# Patient Record
Sex: Female | Born: 1945 | Race: White | Hispanic: No | Smoking: Never smoker
Health system: Southern US, Community
[De-identification: ages and names within clinical notes are randomized; demographics above are authoritative.]

## PROBLEM LIST (undated history)

## (undated) DIAGNOSIS — I8390 Asymptomatic varicose veins of unspecified lower extremity: Secondary | ICD-10-CM

## (undated) HISTORY — DX: Asymptomatic varicose veins of unspecified lower extremity: I83.90

## (undated) HISTORY — PX: VEIN LIGATION AND STRIPPING: SHX2653

## (undated) HISTORY — PX: ENDOVENOUS ABLATION SAPHENOUS VEIN W/ LASER: SUR449

---

## 1998-01-27 ENCOUNTER — Ambulatory Visit (HOSPITAL_COMMUNITY): Admission: RE | Admit: 1998-01-27 | Discharge: 1998-01-27 | Payer: Self-pay | Admitting: Family Medicine

## 1998-12-02 ENCOUNTER — Emergency Department (HOSPITAL_COMMUNITY): Admission: EM | Admit: 1998-12-02 | Discharge: 1998-12-02 | Payer: Self-pay | Admitting: Emergency Medicine

## 1999-01-24 ENCOUNTER — Ambulatory Visit (HOSPITAL_COMMUNITY): Admission: RE | Admit: 1999-01-24 | Discharge: 1999-01-24 | Payer: Self-pay

## 1999-01-31 ENCOUNTER — Ambulatory Visit (HOSPITAL_COMMUNITY): Admission: RE | Admit: 1999-01-31 | Discharge: 1999-01-31 | Payer: Self-pay | Admitting: Family Medicine

## 1999-01-31 ENCOUNTER — Encounter: Payer: Self-pay | Admitting: Family Medicine

## 2000-01-31 ENCOUNTER — Ambulatory Visit: Admission: RE | Admit: 2000-01-31 | Discharge: 2000-01-31 | Payer: Self-pay

## 2000-10-22 ENCOUNTER — Encounter: Payer: Self-pay | Admitting: Family Medicine

## 2000-10-22 ENCOUNTER — Ambulatory Visit (HOSPITAL_COMMUNITY): Admission: RE | Admit: 2000-10-22 | Discharge: 2000-10-22 | Payer: Self-pay | Admitting: Family Medicine

## 2002-11-03 ENCOUNTER — Ambulatory Visit (HOSPITAL_COMMUNITY): Admission: RE | Admit: 2002-11-03 | Discharge: 2002-11-03 | Payer: Self-pay | Admitting: Family Medicine

## 2002-11-03 ENCOUNTER — Encounter: Payer: Self-pay | Admitting: Family Medicine

## 2003-04-05 ENCOUNTER — Encounter: Payer: Self-pay | Admitting: Family Medicine

## 2003-04-05 ENCOUNTER — Ambulatory Visit (HOSPITAL_COMMUNITY): Admission: RE | Admit: 2003-04-05 | Discharge: 2003-04-05 | Payer: Self-pay | Admitting: Family Medicine

## 2004-09-20 ENCOUNTER — Other Ambulatory Visit: Admission: RE | Admit: 2004-09-20 | Discharge: 2004-09-20 | Payer: Self-pay | Admitting: Family Medicine

## 2004-09-26 ENCOUNTER — Ambulatory Visit (HOSPITAL_COMMUNITY): Admission: RE | Admit: 2004-09-26 | Discharge: 2004-09-26 | Payer: Self-pay | Admitting: Family Medicine

## 2005-11-05 ENCOUNTER — Ambulatory Visit (HOSPITAL_COMMUNITY): Admission: RE | Admit: 2005-11-05 | Discharge: 2005-11-05 | Payer: Self-pay | Admitting: Family Medicine

## 2007-01-02 ENCOUNTER — Encounter: Admission: RE | Admit: 2007-01-02 | Discharge: 2007-01-02 | Payer: Self-pay | Admitting: Family Medicine

## 2008-04-22 ENCOUNTER — Other Ambulatory Visit: Admission: RE | Admit: 2008-04-22 | Discharge: 2008-04-22 | Payer: Self-pay | Admitting: Family Medicine

## 2009-06-25 HISTORY — PX: JOINT REPLACEMENT: SHX530

## 2009-11-11 ENCOUNTER — Encounter: Admission: RE | Admit: 2009-11-11 | Discharge: 2009-11-11 | Payer: Self-pay | Admitting: Orthopedic Surgery

## 2010-03-29 ENCOUNTER — Inpatient Hospital Stay (HOSPITAL_COMMUNITY): Admission: RE | Admit: 2010-03-29 | Discharge: 2010-04-01 | Payer: Self-pay | Admitting: Orthopedic Surgery

## 2010-09-07 LAB — APTT: aPTT: 34 seconds (ref 24–37)

## 2010-09-07 LAB — URINALYSIS, ROUTINE W REFLEX MICROSCOPIC
Hgb urine dipstick: NEGATIVE
Specific Gravity, Urine: 1.008 (ref 1.005–1.030)

## 2010-09-07 LAB — CBC
HCT: 36.3 % (ref 36.0–46.0)
Hemoglobin: 10.1 g/dL — ABNORMAL LOW (ref 12.0–15.0)
Hemoglobin: 11.7 g/dL — ABNORMAL LOW (ref 12.0–15.0)
MCH: 31.6 pg (ref 26.0–34.0)
MCH: 31.8 pg (ref 26.0–34.0)
MCHC: 34 g/dL (ref 30.0–36.0)
MCHC: 34 g/dL (ref 30.0–36.0)
MCHC: 34.4 g/dL (ref 30.0–36.0)
MCV: 92.6 fL (ref 78.0–100.0)
MCV: 93.1 fL (ref 78.0–100.0)
MCV: 93.1 fL (ref 78.0–100.0)
Platelets: 261 10*3/uL (ref 150–400)
Platelets: 311 10*3/uL (ref 150–400)
RBC: 3.19 MIL/uL — ABNORMAL LOW (ref 3.87–5.11)
RDW: 13.2 % (ref 11.5–15.5)
RDW: 13.2 % (ref 11.5–15.5)
RDW: 13.3 % (ref 11.5–15.5)
WBC: 8.3 10*3/uL (ref 4.0–10.5)
WBC: 8.8 10*3/uL (ref 4.0–10.5)

## 2010-09-07 LAB — PROTIME-INR
INR: 1.75 — ABNORMAL HIGH (ref 0.00–1.49)
INR: 0.96 (ref 0.00–1.49)
Prothrombin Time: 27.5 seconds — ABNORMAL HIGH (ref 11.6–15.2)
Prothrombin Time: 13 seconds (ref 11.6–15.2)

## 2010-09-07 LAB — COMPREHENSIVE METABOLIC PANEL
ALT: 49 U/L — ABNORMAL HIGH (ref 0–35)
CO2: 27 mEq/L (ref 19–32)
Calcium: 9.5 mg/dL (ref 8.4–10.5)
Chloride: 110 mEq/L (ref 96–112)
GFR calc Af Amer: >60 mL/min (ref >60)
Potassium: 3.8 mEq/L (ref 3.5–5.1)
Sodium: 143 mEq/L (ref 135–145)
Total Bilirubin: 0.7 mg/dL (ref 0.3–1.2)

## 2010-09-07 LAB — BASIC METABOLIC PANEL
BUN: 7 mg/dL (ref 6–23)
CO2: 26 mEq/L (ref 19–32)
Calcium: 9 mg/dL (ref 8.4–10.5)
Creatinine, Ser: 0.69 mg/dL (ref 0.4–1.2)
GFR calc Af Amer: >60 mL/min (ref >60)
GFR calc non Af Amer: >60 mL/min (ref >60)
Potassium: 4 mEq/L (ref 3.5–5.1)

## 2010-09-07 LAB — URINE MICROSCOPIC-ADD ON

## 2010-09-07 LAB — TYPE AND SCREEN: ABO/RH(D): O POS

## 2010-09-07 LAB — SURGICAL PCR SCREEN: Staphylococcus aureus: POSITIVE — AB

## 2011-02-20 ENCOUNTER — Other Ambulatory Visit (HOSPITAL_COMMUNITY): Payer: Self-pay | Admitting: Family Medicine

## 2011-02-20 DIAGNOSIS — Z1231 Encounter for screening mammogram for malignant neoplasm of breast: Secondary | ICD-10-CM

## 2011-03-01 ENCOUNTER — Ambulatory Visit (HOSPITAL_COMMUNITY)
Admission: RE | Admit: 2011-03-01 | Discharge: 2011-03-01 | Disposition: A | Discharge status: Home / Self Care | Payer: BC Managed Care – PPO | Source: Ambulatory Visit | Attending: Family Medicine | Admitting: Family Medicine

## 2011-03-01 DIAGNOSIS — Z1231 Encounter for screening mammogram for malignant neoplasm of breast: Secondary | ICD-10-CM | POA: Insufficient documentation

## 2011-03-29 ENCOUNTER — Other Ambulatory Visit (HOSPITAL_COMMUNITY)
Admission: RE | Admit: 2011-03-29 | Discharge: 2011-03-29 | Disposition: A | Discharge status: Home / Self Care | Payer: BC Managed Care – PPO | Source: Ambulatory Visit | Attending: Family Medicine | Admitting: Family Medicine

## 2011-03-29 ENCOUNTER — Other Ambulatory Visit: Payer: Self-pay | Admitting: Family Medicine

## 2011-03-29 DIAGNOSIS — Z01419 Encounter for gynecological examination (general) (routine) without abnormal findings: Secondary | ICD-10-CM | POA: Insufficient documentation

## 2013-02-19 ENCOUNTER — Other Ambulatory Visit (HOSPITAL_COMMUNITY): Payer: Self-pay | Admitting: Family Medicine

## 2013-02-19 DIAGNOSIS — Z1231 Encounter for screening mammogram for malignant neoplasm of breast: Secondary | ICD-10-CM

## 2013-03-04 ENCOUNTER — Ambulatory Visit (HOSPITAL_COMMUNITY)
Admission: RE | Admit: 2013-03-04 | Discharge: 2013-03-04 | Disposition: A | Discharge status: Home / Self Care | Payer: Medicare PPO | Source: Ambulatory Visit | Attending: Family Medicine | Admitting: Family Medicine

## 2013-03-04 DIAGNOSIS — Z1231 Encounter for screening mammogram for malignant neoplasm of breast: Secondary | ICD-10-CM | POA: Insufficient documentation

## 2014-02-24 ENCOUNTER — Other Ambulatory Visit (HOSPITAL_COMMUNITY): Payer: Self-pay | Admitting: Family Medicine

## 2014-02-24 DIAGNOSIS — Z1231 Encounter for screening mammogram for malignant neoplasm of breast: Secondary | ICD-10-CM

## 2014-03-12 ENCOUNTER — Ambulatory Visit (HOSPITAL_COMMUNITY)
Admission: RE | Admit: 2014-03-12 | Discharge: 2014-03-12 | Disposition: A | Discharge status: Home / Self Care | Payer: Medicare PPO | Source: Ambulatory Visit | Attending: Family Medicine | Admitting: Family Medicine

## 2014-03-12 DIAGNOSIS — Z1231 Encounter for screening mammogram for malignant neoplasm of breast: Secondary | ICD-10-CM | POA: Diagnosis not present

## 2014-09-29 ENCOUNTER — Other Ambulatory Visit: Payer: Self-pay | Admitting: Family Medicine

## 2014-09-29 ENCOUNTER — Other Ambulatory Visit (HOSPITAL_COMMUNITY)
Admission: RE | Admit: 2014-09-29 | Discharge: 2014-09-29 | Disposition: A | Discharge status: Home / Self Care | Payer: Medicare PPO | Source: Ambulatory Visit | Attending: Family Medicine | Admitting: Family Medicine

## 2014-09-29 DIAGNOSIS — Z0000 Encounter for general adult medical examination without abnormal findings: Secondary | ICD-10-CM | POA: Diagnosis not present

## 2014-09-29 DIAGNOSIS — Z23 Encounter for immunization: Secondary | ICD-10-CM | POA: Diagnosis not present

## 2014-09-29 DIAGNOSIS — Z124 Encounter for screening for malignant neoplasm of cervix: Secondary | ICD-10-CM | POA: Diagnosis not present

## 2014-09-29 DIAGNOSIS — Z13220 Encounter for screening for lipoid disorders: Secondary | ICD-10-CM | POA: Diagnosis not present

## 2014-09-29 DIAGNOSIS — Z136 Encounter for screening for cardiovascular disorders: Secondary | ICD-10-CM | POA: Diagnosis not present

## 2014-09-30 LAB — CYTOLOGY - PAP

## 2015-02-24 ENCOUNTER — Other Ambulatory Visit (HOSPITAL_COMMUNITY): Payer: Self-pay | Admitting: Family Medicine

## 2015-02-24 DIAGNOSIS — Z1231 Encounter for screening mammogram for malignant neoplasm of breast: Secondary | ICD-10-CM

## 2015-03-09 DIAGNOSIS — Z23 Encounter for immunization: Secondary | ICD-10-CM | POA: Diagnosis not present

## 2015-04-05 DIAGNOSIS — Z96641 Presence of right artificial hip joint: Secondary | ICD-10-CM | POA: Diagnosis not present

## 2015-04-05 DIAGNOSIS — Z471 Aftercare following joint replacement surgery: Secondary | ICD-10-CM | POA: Diagnosis not present

## 2015-04-08 ENCOUNTER — Ambulatory Visit
Admission: RE | Admit: 2015-04-08 | Discharge: 2015-04-08 | Disposition: A | Discharge status: Home / Self Care | Payer: Medicare PPO | Source: Ambulatory Visit | Attending: Family Medicine | Admitting: Family Medicine

## 2015-04-08 DIAGNOSIS — Z1231 Encounter for screening mammogram for malignant neoplasm of breast: Secondary | ICD-10-CM

## 2015-07-19 ENCOUNTER — Ambulatory Visit
Admission: RE | Admit: 2015-07-19 | Discharge: 2015-07-19 | Disposition: A | Discharge status: Home / Self Care | Payer: Medicare Other | Source: Ambulatory Visit | Attending: Cardiology | Admitting: Cardiology

## 2015-07-19 ENCOUNTER — Other Ambulatory Visit: Payer: Self-pay | Admitting: Cardiology

## 2015-07-19 DIAGNOSIS — R0789 Other chest pain: Secondary | ICD-10-CM

## 2015-07-22 ENCOUNTER — Other Ambulatory Visit: Payer: Self-pay | Admitting: *Deleted

## 2015-07-22 DIAGNOSIS — I872 Venous insufficiency (chronic) (peripheral): Secondary | ICD-10-CM

## 2015-08-23 ENCOUNTER — Encounter: Payer: Self-pay | Admitting: Vascular Surgery

## 2015-08-24 ENCOUNTER — Encounter: Payer: Medicare Other | Admitting: Vascular Surgery

## 2015-08-24 ENCOUNTER — Encounter (HOSPITAL_COMMUNITY): Payer: Medicare Other

## 2015-08-30 ENCOUNTER — Ambulatory Visit (INDEPENDENT_AMBULATORY_CARE_PROVIDER_SITE_OTHER): Payer: Medicare Other | Admitting: Vascular Surgery

## 2015-08-30 ENCOUNTER — Encounter: Payer: Self-pay | Admitting: Vascular Surgery

## 2015-08-30 ENCOUNTER — Ambulatory Visit (HOSPITAL_COMMUNITY)
Admission: RE | Admit: 2015-08-30 | Discharge: 2015-08-30 | Disposition: A | Discharge status: Home / Self Care | Payer: Medicare Other | Source: Ambulatory Visit | Attending: Vascular Surgery | Admitting: Vascular Surgery

## 2015-08-30 VITALS — BP 123/73 | HR 62 | Temp 97.6°F | Resp 16 | Ht 65.5 in | Wt 160.0 lb

## 2015-08-30 DIAGNOSIS — I83892 Varicose veins of left lower extremities with other complications: Secondary | ICD-10-CM

## 2015-08-30 DIAGNOSIS — I83899 Varicose veins of unspecified lower extremities with other complications: Secondary | ICD-10-CM | POA: Insufficient documentation

## 2015-08-30 DIAGNOSIS — I872 Venous insufficiency (chronic) (peripheral): Secondary | ICD-10-CM

## 2015-08-30 DIAGNOSIS — R0609 Other forms of dyspnea: Secondary | ICD-10-CM | POA: Diagnosis present

## 2015-08-30 NOTE — Progress Notes (Signed)
Subjective:     Patient ID: Whitney [name], female   DOB: July 14, 1945, 70 y.o.   MRN: [id number]  HPI  This 70 year old female is evaluated for painful varicosities in the left leg. She was referred by Dr. Viann FishSpencer Tilley.  Patient has a history of vein stripping in the left leg by Dr. Marcy PanningBill Bowman around 2000. Patient developed recurrent varicosities in the left calf medially and laterally. It sounds as if the vein was stripped from the ankle to the knee. She has no history of DVT or thrombophlebitis. She does have a history of a stasis ulcer in the left ankle has chronic swelling in both ankles left worsening right. She does wear short leg elastic compression stockings bilaterally. She develops aching throbbing and burning discomfort as the day progresses.  Past Medical History  Diagnosis Date  . Varicose veins     Social History  Substance Use Topics  . Smoking status: Never Smoker   . Smokeless tobacco: Never Used  . Alcohol Use: No    Family History  Problem Relation Age of Onset  . Osteoporosis Mother   . Cancer Mother     bone  . COPD Father     Allergies  Allergen Reactions  . Amoxicillin Rash     Current outpatient prescriptions:  .  ALENDRONATE SODIUM PO, Take 70 mg by mouth once a week., Disp: , Rfl:  .  Calcium Carb-Cholecalciferol (CALCIUM 1000 + D PO), Take by mouth daily., Disp: , Rfl:  .  metronidazole (NORITATE) 1 % cream, Apply topically daily., Disp: , Rfl:  .  Multiple Vitamins-Minerals (MULTIVITAMIN ADULT PO), Take by mouth daily., Disp: , Rfl:   Filed Vitals:   08/30/15 1117  BP: 123/73  Pulse: 62  Temp: 97.6 F (36.4 C)  Resp: 16  Height: 5' 5.5" (1.664 m)  Weight: 160 lb (72.576 kg)  SpO2: 99%    Body mass index is 26.21 kg/(m^2).          Review of Systems  Denies chest pain, dyspnea on exertion, PND, orthopnea, hemoptysis, claudication     Objective:   Physical Exam BP 123/73 mmHg  Pulse 62  Temp(Src) 97.6 F (36.4 C)  Resp 16   Ht 5' 5.5" (1.664 m)  Wt 160 lb (72.576 kg)  BMI 26.21 kg/m2  SpO2 99%  Gen.-alert and oriented x3 in no apparent distress HEENT normal for age Lungs no rhonchi or wheezing Cardiovascular regular rhythm no murmurs carotid pulses 3+ palpable no bruits audible Abdomen soft nontender no palpable masses Musculoskeletal free of  major deformities Skin clear -no rashes Neurologic normal Lower extremities 3+ femoral and dorsalis pedis pulses palpable bilaterally with  1+ edema on the left. Left leg with extensive bulging varicosities beginning at the knee level medially and in the pretibial and lateral calf extending down to both malleolar I. 1+ edema. Evidence of healed ulcer over medial malleolar area on the left. 3+ dorsalis pedis pulse palpable bilaterally.  Right leg with lesser degree of varicosities in the medial calf.   Today I ordered a venous duplex exam the both legs which I reviewed and interpreted. The left great saphenous vein is large and patent from the saphenofemoral junction to the knee with gross reflux supplying these painful varicosities. Left small saphenous vein has no reflux and there is reflux in the deep system on the left. Right leg has no reflux in the small or great saphenous systems.     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 This is due to gross reflux in the left great saphenous vein from the saphenofemoral junction to the knee which is supplying these painful varicosities. These are causing symptoms which are affecting patient's daily living  And also she is developing worsening skin with history of stasis ulcer.     Plan:         #1 long leg elastic compression stockings 20-30 mm gradient #2 elevate legs as much as possible #3 ibuprofen daily on a regular basis for pain #4 return in 3 months-if no significant improvement then  She will need #1 laser ablation left great saphenous  vein followed by three-month waiting.. Following this she will need to be evaluated for multiple stab phlebectomy of left leg painful varicosities   She will return in 3 months

## 2015-12-01 ENCOUNTER — Encounter: Payer: Self-pay | Admitting: Vascular Surgery

## 2015-12-06 ENCOUNTER — Ambulatory Visit (INDEPENDENT_AMBULATORY_CARE_PROVIDER_SITE_OTHER): Payer: Medicare Other | Admitting: Vascular Surgery

## 2015-12-06 ENCOUNTER — Encounter: Payer: Self-pay | Admitting: Vascular Surgery

## 2015-12-06 ENCOUNTER — Other Ambulatory Visit: Payer: Self-pay | Admitting: *Deleted

## 2015-12-06 VITALS — BP 126/79 | HR 70 | Temp 98.0°F | Resp 16 | Ht 65.5 in | Wt 158.0 lb

## 2015-12-06 DIAGNOSIS — I83812 Varicose veins of left lower extremities with pain: Secondary | ICD-10-CM

## 2015-12-06 DIAGNOSIS — I83892 Varicose veins of left lower extremities with other complications: Secondary | ICD-10-CM | POA: Diagnosis not present

## 2015-12-06 NOTE — Progress Notes (Signed)
Subjective:     Patient ID: Whitney [name], female   DOB: [birth year], 70 y.o.   MRN: [id number]  HPI this 70 year old female returns for continued follow-up regarding her painful varicosities in the left leg. She had previous vein stripping by Dr. Marcy PanningBill Bowman from the ankle to the knee in 2001. She developed recurrent bulges in the calf and distal thigh area which have become increasingly symptomatic with aching throbbing and burning discomfort. She is tried long-leg elastic compression stockings 20-30 millimeter gradient as well as elevation and ibuprofen with no improvement in her symptoms. This is affecting her daily living. She also developed swelling in the ankle as the day progresses.  Past Medical History  Diagnosis Date  . Varicose veins     Social History  Substance Use Topics  . Smoking status: Never Smoker   . Smokeless tobacco: Never Used  . Alcohol Use: No    Family History  Problem Relation Age of Onset  . Osteoporosis Mother   . Cancer Mother     bone  . COPD Father     Allergies  Allergen Reactions  . Amoxicillin Rash     Current outpatient prescriptions:  .  ALENDRONATE SODIUM PO, Take 70 mg by mouth once a week., Disp: , Rfl:  .  aspirin 81 MG tablet, Take 81 mg by mouth daily., Disp: , Rfl:  .  Calcium Carb-Cholecalciferol (CALCIUM 1000 + D PO), Take by mouth daily., Disp: , Rfl:  .  metronidazole (NORITATE) 1 % cream, Apply topically daily., Disp: , Rfl:  .  Multiple Vitamins-Minerals (MULTIVITAMIN ADULT PO), Take by mouth daily., Disp: , Rfl:   Filed Vitals:   12/06/15 1055  BP: 126/79  Pulse: 70  Temp: 98 F (36.7 C)  Resp: 16  Height: 5' 5.5" (1.664 m)  Weight: 158 lb (71.668 kg)  SpO2: 100%    Body mass index is 25.88 kg/(m^2).         Review of Systems denies chest pain, dyspnea on exertion, PND, orthopnea, hemoptysis, claudication    Objective:   Physical Exam BP 126/79 mmHg  Pulse 70  Temp(Src) 98 F (36.7 C)  Resp 16  Ht 5'  5.5" (1.664 m)  Wt 158 lb (71.668 kg)  BMI 25.88 kg/m2  SpO2 100%  Gen. well-developed well-nourished female no apparent distress alert and oriented 3 Lungs no rhonchi or wheezing Left leg with bulging varicosities beginning in the distal medial thigh into the medial calf extending down to the medial malleolus with reticular and spider veins in that area. 1+ edema. 3+ dorsalis pedis pulse palpable. No hyperpigmentation or ulceration noted.  Formal venous duplex exam last visit revealed a large great saphenous vein from the groin to the knee supplying these painful varicosities with gross reflux and no DVT     Assessment:     Painful varicosities left leg due to gross reflux left great saphenous vein causing symptoms which are affecting patient's daily living and resistant to conservative measures including long-leg elastic compression stockings 20-30 millimeter gradient, elevation, and ibuprofen    Plan:     Patient needs #1 laser ablation left great saphenous vein followed by three-month waiting.Marland Kitchen. She will then be evaluated for possible stab phlebectomy and/or sclerotherapy. We will proceed with precertification to perform this in the near future

## 2016-01-26 ENCOUNTER — Encounter: Payer: Self-pay | Admitting: Vascular Surgery

## 2016-01-30 ENCOUNTER — Encounter: Payer: Self-pay | Admitting: Vascular Surgery

## 2016-01-30 ENCOUNTER — Ambulatory Visit (INDEPENDENT_AMBULATORY_CARE_PROVIDER_SITE_OTHER): Payer: Medicare Other | Admitting: Vascular Surgery

## 2016-01-30 VITALS — BP 128/78 | HR 82 | Temp 97.6°F | Resp 16 | Ht 65.5 in | Wt 158.0 lb

## 2016-01-30 DIAGNOSIS — I83892 Varicose veins of left lower extremities with other complications: Secondary | ICD-10-CM | POA: Diagnosis not present

## 2016-01-30 NOTE — Progress Notes (Signed)
Laser Ablation Procedure    Date: 01/30/2016   Whitney LevelLinda A [name] DOB:18-May-1946  Consent signed: Yes    Surgeon:  Dr. Quita SkyeJames D. Hart RochesterLawson  Procedure: Laser Ablation: left Greater Saphenous Vein  BP 128/78   Pulse 82   Temp 97.6 F (36.4 C)   Resp 16   Ht 5' 5.5" (1.664 m)   Wt 158 lb (71.7 kg)   SpO2 100%   BMI 25.89 kg/m   Tumescent Anesthesia: 400 cc 0.9% NaCl with 50 cc Lidocaine HCL with 1% Epi and 15 cc 8.4% NaHCO3  Local Anesthesia: 4 cc Lidocaine HCL and NaHCO3 (ratio 2:1)  Pulsed Mode: 17 watts, 500ms delay, 1.0 duration  Total Energy: 2575             Total Pulses: 153               Total Time: 2:31    Patient tolerated procedure well  Notes:   Description of Procedure:  After marking the course of the secondary varicosities, the patient was placed on the operating table in the supine position, and the left leg was prepped and draped in sterile fashion.   Local anesthetic was administered and under ultrasound guidance the saphenous vein was accessed with a micro needle and guide wire; then the mirco puncture sheath was placed.  A guide wire was inserted saphenofemoral junction , followed by a 5 french sheath.  The position of the sheath and then the laser fiber below the junction was confirmed using the ultrasound.  Tumescent anesthesia was administered along the course of the saphenous vein using ultrasound guidance. The patient was placed in Trendelenburg position and protective laser glasses were placed on patient and staff, and the laser was fired at 15 watts continuous mode advancing 1-852mm/second for a total of 2575 joules.     Steri strips were applied to the stab wounds and ABD pads and thigh high compression stockings were applied.  Ace wrap bandages were applied over the phlebectomy sites and at the top of the saphenofemoral junction. Blood loss was less than 15 cc.  The patient ambulated out of the operating room having tolerated the procedure well.

## 2016-01-30 NOTE — Progress Notes (Signed)
Subjective:     Patient ID: Whitney LevelLinda A [name], female   DOB: 02/07/1946, 70 y.o.   MRN: [id number]  HPI this 70 year old female had laser ablation left great saphenous vein from the proximal calf to near the saphenofemoral junction performed under local tumescent anesthesia. A total of 2580 J of energy was utilized. Because of the large caliber of the vein near the saphenofemoral junction the energy was increased from 15 to 17. Patient tolerated the procedure well.   Review of Systems     Objective:   Physical Exam BP 128/78   Pulse 82   Temp 97.6 F (36.4 C)   Resp 16   Ht 5' 5.5" (1.664 m)   Wt 158 lb (71.7 kg)   SpO2 100%   BMI 25.89 kg/m        Assessment:     Well tolerated laser ablation left great saphenous vein performed under local tumescent anesthesia    Plan:     Patient return in 2 weeks for venous duplex exam to confirm closure left great saphenous vein

## 2016-01-31 ENCOUNTER — Telehealth: Payer: Self-pay | Admitting: *Deleted

## 2016-01-31 ENCOUNTER — Encounter: Payer: Self-pay | Admitting: Vascular Surgery

## 2016-01-31 NOTE — Telephone Encounter (Signed)
Pt doing well. No questions or concerns. Following all instructions. 

## 2016-02-07 ENCOUNTER — Encounter: Payer: Self-pay | Admitting: Vascular Surgery

## 2016-02-13 ENCOUNTER — Ambulatory Visit (HOSPITAL_COMMUNITY)
Admission: RE | Admit: 2016-02-13 | Discharge: 2016-02-13 | Disposition: A | Discharge status: Home / Self Care | Payer: Medicare Other | Source: Ambulatory Visit | Attending: Vascular Surgery | Admitting: Vascular Surgery

## 2016-02-13 ENCOUNTER — Encounter: Payer: Self-pay | Admitting: Vascular Surgery

## 2016-02-13 ENCOUNTER — Ambulatory Visit (INDEPENDENT_AMBULATORY_CARE_PROVIDER_SITE_OTHER): Payer: Medicare Other | Admitting: Vascular Surgery

## 2016-02-13 VITALS — BP 120/77 | HR 62 | Temp 97.8°F | Resp 16 | Ht 65.5 in | Wt 159.0 lb

## 2016-02-13 DIAGNOSIS — I83812 Varicose veins of left lower extremities with pain: Secondary | ICD-10-CM | POA: Insufficient documentation

## 2016-02-13 DIAGNOSIS — I83892 Varicose veins of left lower extremities with other complications: Secondary | ICD-10-CM

## 2016-02-13 DIAGNOSIS — Z9889 Other specified postprocedural states: Secondary | ICD-10-CM | POA: Diagnosis present

## 2016-02-13 NOTE — Progress Notes (Signed)
Subjective:     Patient ID: Whitney LevelLinda A [name], female   DOB: 02/01/1946, 70 y.o.   MRN: [id number]  HPI this 70 year old female returns 2 weeks post laser ablation left great saphenous vein for painful varicosities due to gross reflux. Patient had a remote history of vein stripping by Dr. Marcy PanningBill Bowman many years ago. She states that she did have some discomfort along the course of the ablation in the mid to proximal thigh which is now resolved. She has worn her long leg elastic compression stockings as instructed and taken ibuprofen. She denies any distal edema at this time.  Past Medical History:  Diagnosis Date  . Varicose veins     Social History  Substance Use Topics  . Smoking status: Never Smoker  . Smokeless tobacco: Never Used  . Alcohol use No    Family History  Problem Relation Age of Onset  . Osteoporosis Mother   . Cancer Mother     bone  . COPD Father     Allergies  Allergen Reactions  . Amoxicillin Rash     Current Outpatient Prescriptions:  .  ALENDRONATE SODIUM PO, Take 70 mg by mouth once a week., Disp: , Rfl:  .  aspirin 81 MG tablet, Take 81 mg by mouth daily., Disp: , Rfl:  .  Calcium Carb-Cholecalciferol (CALCIUM 1000 + D PO), Take by mouth daily., Disp: , Rfl:  .  metronidazole (NORITATE) 1 % cream, Apply topically daily., Disp: , Rfl:  .  Multiple Vitamins-Minerals (MULTIVITAMIN ADULT PO), Take by mouth daily., Disp: , Rfl:   Vitals:   02/13/16 1349  BP: 120/77  Pulse: 62  Resp: 16  Temp: 97.8 F (36.6 C)  SpO2: 98%  Weight: 159 lb (72.1 kg)  Height: 5' 5.5" (1.664 m)    Body mass index is 26.06 kg/m.        Review of Systems denies chest pain, dyspnea on exertion, PND, orthopnea, hemoptysis, claudication     Objective:   Physical Exam BP 120/77   Pulse 62   Temp 97.8 F (36.6 C)   Resp 16   Ht 5' 5.5" (1.664 m)   Wt 159 lb (72.1 kg)   SpO2 98%   BMI 26.06 kg/m   Gen. well-developed well-nourished female no apparent distress  alert and oriented 3 Lungs no rhonchi or wheezing Cardiovascular regular rhythm no murmurs Left leg with mild discomfort to deep palpation of great saphenous vein in mid to proximal thigh. Bulging varicosities in the left pretibial region down to near the medial malleolus.  Today I ordered a venous duplex exam the left leg which I reviewed and interpreted. There is no DVT. There is total closure of the left great saphenous vein up to near the saphenofemoral junction.     Assessment:     Successful laser ablation of the large caliber left great saphenous vein supplying painful varicosities left leg    Plan:     Return in 3 months to determine if stab phlebectomy and/or sclerotherapy will be indicated to complete her treatment regimen

## 2016-03-12 ENCOUNTER — Other Ambulatory Visit: Payer: Self-pay | Admitting: Family Medicine

## 2016-03-12 DIAGNOSIS — Z1231 Encounter for screening mammogram for malignant neoplasm of breast: Secondary | ICD-10-CM

## 2016-04-09 ENCOUNTER — Ambulatory Visit
Admission: RE | Admit: 2016-04-09 | Discharge: 2016-04-09 | Disposition: A | Discharge status: Home / Self Care | Payer: Medicare Other | Source: Ambulatory Visit | Attending: Family Medicine | Admitting: Family Medicine

## 2016-04-09 DIAGNOSIS — Z1231 Encounter for screening mammogram for malignant neoplasm of breast: Secondary | ICD-10-CM

## 2016-05-09 ENCOUNTER — Encounter: Payer: Self-pay | Admitting: Vascular Surgery

## 2016-05-22 ENCOUNTER — Encounter: Payer: Self-pay | Admitting: Vascular Surgery

## 2016-05-22 ENCOUNTER — Ambulatory Visit (INDEPENDENT_AMBULATORY_CARE_PROVIDER_SITE_OTHER): Payer: Medicare Other | Admitting: Vascular Surgery

## 2016-05-22 VITALS — BP 114/73 | HR 72 | Resp 16 | Ht 65.5 in | Wt 160.0 lb

## 2016-05-22 DIAGNOSIS — I83892 Varicose veins of left lower extremities with other complications: Secondary | ICD-10-CM | POA: Diagnosis not present

## 2016-05-22 NOTE — Progress Notes (Signed)
Subjective:     Patient ID: Whitney [name], female   DOB: 15-Oct-1945, 70 y.o.   MRN: [id number]  HPI This 70 year old female returns 3 months post-laser ablation left great saphenous vein for painful varicosities with gross reflux. She has worn her long leg elastic compression stockings 30-40  millimeter gradient and tried elevation and ibuprofen for the residual painful varicosities. She has continued have aching and burning discomfort in the varicosities although they are less prominent. She has had no distal edema.  Past Medical History:  Diagnosis Date  . Varicose veins     Social History  Substance Use Topics  . Smoking status: Never Smoker  . Smokeless tobacco: Never Used  . Alcohol use No    Family History  Problem Relation Age of Onset  . Osteoporosis Mother   . Cancer Mother     bone  . COPD Father     Allergies  Allergen Reactions  . Amoxicillin Rash     Current Outpatient Prescriptions:  .  ALENDRONATE SODIUM PO, Take 70 mg by mouth once a week., Disp: , Rfl:  .  aspirin 81 MG tablet, Take 81 mg by mouth daily., Disp: , Rfl:  .  Calcium Carb-Cholecalciferol (CALCIUM 1000 + D PO), Take by mouth daily., Disp: , Rfl:  .  metronidazole (NORITATE) 1 % cream, Apply topically daily., Disp: , Rfl:  .  Multiple Vitamins-Minerals (MULTIVITAMIN ADULT PO), Take by mouth daily., Disp: , Rfl:   Vitals:   05/22/16 1039  BP: 114/73  Pulse: 72  Resp: 16  SpO2: 98%  Weight: 160 lb (72.6 kg)  Height: 5' 5.5" (1.664 m)    Body mass index is 26.22 kg/m.         Review of Systems Denies chest pain, dyspnea on exertion, PND, orthopnea, hemoptysis, claudication    Objective:   Physical Exam   BP 114/73 Comment: left arm sitting  Pulse 72   Resp 16   Ht 5' 5.5" (1.664 m)   Wt 160 lb (72.6 kg)   SpO2 98%   BMI 26.22 kg/m   Gen. well-developed well-nourished female no apparent distress alert and oriented 3 Lungs no rhonchi or wheezing Cardiovascular regular  rhythm no murmurs Left leg with no tenderness along course of great saphenous vein. No bruising. Bulging varicosities or less prominent but still visible and medial calf and lateral calf area extending down to dorsum of foot. 3+ dorsalis pedis pulse palpable. Assessment:     Residual painful varicosities left leg 3 months post-laser ablation left great saphenous vein for gross reflux Varicosities continue to affect patient's daily living despite compression therapy    Plan:     Patient needs multiple stab phlebectomy-greater than 20-of painful varicosities left leg We will proceed with precertification to perform this in the near future

## 2016-07-16 ENCOUNTER — Other Ambulatory Visit: Payer: Medicare Other | Admitting: Vascular Surgery

## 2016-07-17 ENCOUNTER — Encounter: Payer: Self-pay | Admitting: Vascular Surgery

## 2016-07-23 ENCOUNTER — Ambulatory Visit (INDEPENDENT_AMBULATORY_CARE_PROVIDER_SITE_OTHER): Payer: Medicare Other | Admitting: Vascular Surgery

## 2016-07-23 ENCOUNTER — Encounter: Payer: Self-pay | Admitting: Vascular Surgery

## 2016-07-23 VITALS — BP 138/78 | HR 71 | Temp 97.8°F | Resp 16 | Ht 65.5 in | Wt 160.0 lb

## 2016-07-23 DIAGNOSIS — I83892 Varicose veins of left lower extremities with other complications: Secondary | ICD-10-CM

## 2016-07-23 NOTE — Progress Notes (Signed)
Subjective:     Patient ID: [name], female   DOB: 06-24-1946, 71 y.o.   MRN: [id number]  HPI This 71 year old female had multiple stab phlebectomy of painful varicosities in the left leg-greater than 20 performed under local tumescent anesthesia. She tolerated the procedure well.  Review of Systems     Objective:   Physical Exam BP 138/78   Pulse 71   Temp 97.8 F (36.6 C)   Resp 16   Ht 5' 5.5" (1.664 m)   Wt 160 lb (72.6 kg)   SpO2 98%   BMI 26.22 kg/m       Assessment:     Well-tolerated multiple stab phlebectomy painful varicosities left leg-greater than 20 performed under local tumescent anesthesia    Plan:     Return in 3 months for final follow-up

## 2016-07-23 NOTE — Progress Notes (Signed)
    Stab Phlebectomy Procedure  Whitney LevelLinda A [name] DOB:1945/10/08  07/23/2016  Consent signed: Yes  Surgeon:J.D. Hart RochesterLawson  Procedure: stab phlebectomy: left leg  BP 138/78   Pulse 71   Temp 97.8 F (36.6 C)   Resp 16   Ht 5' 5.5" (1.664 m)   Wt 160 lb (72.6 kg)   SpO2 98%   BMI 26.22 kg/m   Start time: 11am   End time: 11:45am   Tumescent Anesthesia: 200 cc 0.9% NaCl with 50 cc Lidocaine HCL with 1% Epi and 15 cc 8.4% NaHCO3  Local Anesthesia: 5 cc Lidocaine HCL and NaHCO3 (ratio 2:1)    Stab Phlebectomy: >20 Sites: Thigh, Calf and Ankle  Patient tolerated procedure well: Yes  Notes:   Description of Procedure:  After marking the course of the secondary varicosities, the patient was placed on the operating table in the supine position, and the left leg was prepped and draped in sterile fashion.    The patient was then put into Trendelenburg position.  Local anesthetic was administered at the previously marked varicosities, and tumescent anesthesia was administered around the vessels.  Greater than 20 stab wounds were made using the tip of an 11 blade. And using the vein hook, the phlebectomies were performed using a hemostat to avulse the varicosities.  Adequate hemostasis was achieved, and steri strips were applied to the stab wound.      ABD pads and thigh high compression stockings were applied as well ace wraps where needed. Blood loss was less than 15 cc.  The patient ambulated out of the operating room having tolerated the procedure well.

## 2016-07-24 ENCOUNTER — Encounter: Payer: Self-pay | Admitting: Vascular Surgery

## 2016-10-12 ENCOUNTER — Encounter: Payer: Self-pay | Admitting: Vascular Surgery

## 2016-10-22 ENCOUNTER — Encounter: Payer: Self-pay | Admitting: Vascular Surgery

## 2016-10-22 ENCOUNTER — Ambulatory Visit (INDEPENDENT_AMBULATORY_CARE_PROVIDER_SITE_OTHER): Payer: Medicare Other | Admitting: Vascular Surgery

## 2016-10-22 VITALS — BP 123/83 | HR 66 | Temp 97.5°F | Resp 16 | Ht 65.5 in | Wt 160.0 lb

## 2016-10-22 DIAGNOSIS — I83892 Varicose veins of left lower extremities with other complications: Secondary | ICD-10-CM

## 2016-10-22 NOTE — Progress Notes (Signed)
Subjective:     Patient ID: Whitney [name], female   DOB: 27-Mar-1946, 71 y.o.   MRN: [id number]  HPI This 71 year old female returns 3 months post-multiple stab phlebectomy of painful varicosities in the left leg. Prior to that she underwent laser ablation of the left great saphenous vein. She has a remote history of vein stripping by Dr. Marcy Panning many years ago which was performed from the ankle to the knee [name]. She has had no pain in the left leg since her procedure. She does occasionally develop some very mild edema in the left ankle. She does wear short leg elastic compression stockings frequently. She's had no history of skin breakdown, DVT, or bleeding.  Past Medical History:  Diagnosis Date  . Varicose veins     Social History  Substance Use Topics  . Smoking status: Never Smoker  . Smokeless tobacco: Never Used  . Alcohol use No    Family History  Problem Relation Age of Onset  . Osteoporosis Mother   . Cancer Mother     bone  . COPD Father     Allergies  Allergen Reactions  . Amoxicillin Rash     Current Outpatient Prescriptions:  .  aspirin 81 MG tablet, Take 81 mg by mouth daily., Disp: , Rfl:  .  Calcium Carb-Cholecalciferol (CALCIUM 1000 + D PO), Take by mouth daily., Disp: , Rfl:  .  metronidazole (NORITATE) 1 % cream, Apply topically daily., Disp: , Rfl:  .  Multiple Vitamins-Minerals (MULTIVITAMIN ADULT PO), Take by mouth daily., Disp: , Rfl:  .  ALENDRONATE SODIUM PO, Take 70 mg by mouth once a week., Disp: , Rfl:   Vitals:   10/22/16 1030  BP: 123/83  Pulse: 66  Resp: 16  Temp: 97.5 F (36.4 C)  Weight: 160 lb (72.6 kg)  Height: 5' 5.5" (1.664 m)    Body mass index is 26.22 kg/m.         Review of Systems Denies chest pain, dyspnea on exertion, PND, orthopnea, hemoptysis    Objective:   Physical Exam BP 123/83 (BP Location: Left Arm, Patient Position: Sitting, Cuff Size: Normal)   Pulse 66   Temp 97.5 F (36.4 C)   Resp 16    Ht 5' 5.5" (1.664 m)   Wt 160 lb (72.6 kg)   BMI 26.22 kg/m   Gen. well-developed well-nourished female no apparent distress alert and oriented 3 Lungs no rhonchi or wheezing Left leg with no bulging varicosities noted. No distal edema noted. 3+ dorsalis pedis pulse palpable. One small prominent reticular vein and lateral distal thigh. No hyperpigmentation or ulceration noted.       Assessment:     Successful laser ablation left great saphenous vein with multiple stab phlebectomy of painful varicosities. Excellent early results with no distal edema or symptoms    Plan:     Patient will return to see me on a when necessary basis

## 2017-03-13 ENCOUNTER — Other Ambulatory Visit: Payer: Self-pay | Admitting: Family Medicine

## 2017-03-13 DIAGNOSIS — Z1231 Encounter for screening mammogram for malignant neoplasm of breast: Secondary | ICD-10-CM

## 2017-04-12 ENCOUNTER — Ambulatory Visit
Admission: RE | Admit: 2017-04-12 | Discharge: 2017-04-12 | Disposition: A | Discharge status: Home / Self Care | Payer: Medicare Other | Source: Ambulatory Visit | Attending: Family Medicine | Admitting: Family Medicine

## 2017-04-12 DIAGNOSIS — Z1231 Encounter for screening mammogram for malignant neoplasm of breast: Secondary | ICD-10-CM

## 2018-03-19 ENCOUNTER — Other Ambulatory Visit: Payer: Self-pay | Admitting: Family Medicine

## 2018-03-19 DIAGNOSIS — Z1231 Encounter for screening mammogram for malignant neoplasm of breast: Secondary | ICD-10-CM

## 2018-04-21 ENCOUNTER — Ambulatory Visit
Admission: RE | Admit: 2018-04-21 | Discharge: 2018-04-21 | Disposition: A | Discharge status: Home / Self Care | Payer: Medicare Other | Source: Ambulatory Visit | Attending: Family Medicine | Admitting: Family Medicine

## 2018-04-21 DIAGNOSIS — Z1231 Encounter for screening mammogram for malignant neoplasm of breast: Secondary | ICD-10-CM

## 2019-01-23 IMAGING — MG DIGITAL SCREENING BILATERAL MAMMOGRAM WITH TOMO AND CAD
8 series · 8 of 24 positions shown · non-contrast
Comparison: Previous exam(s).

CLINICAL DATA: Screening.

EXAM:
DIGITAL SCREENING BILATERAL MAMMOGRAM WITH TOMO AND CAD

[R MLO synth-2D]
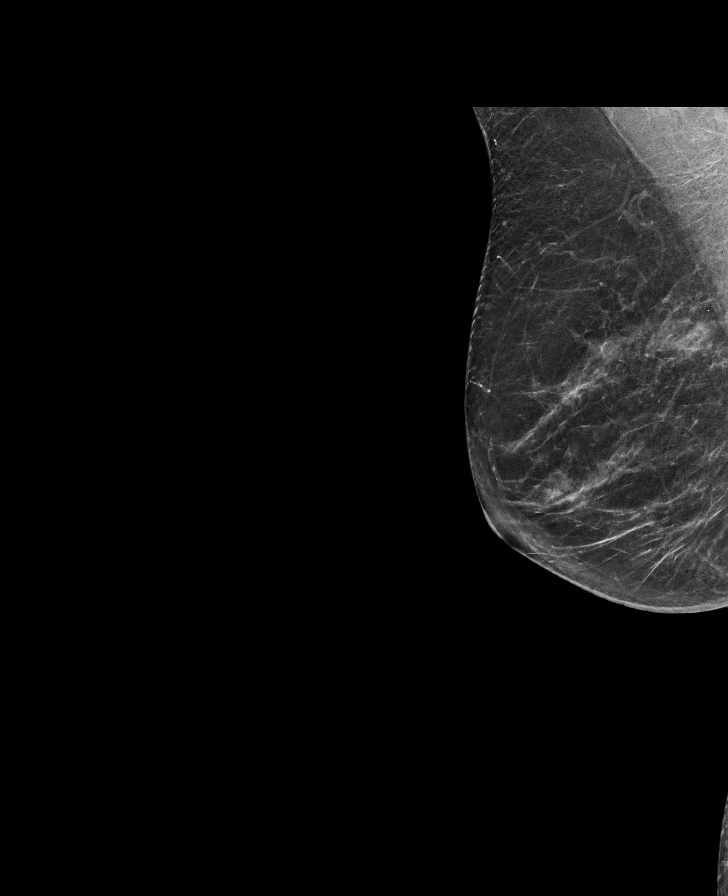

[R CC synth-2D]
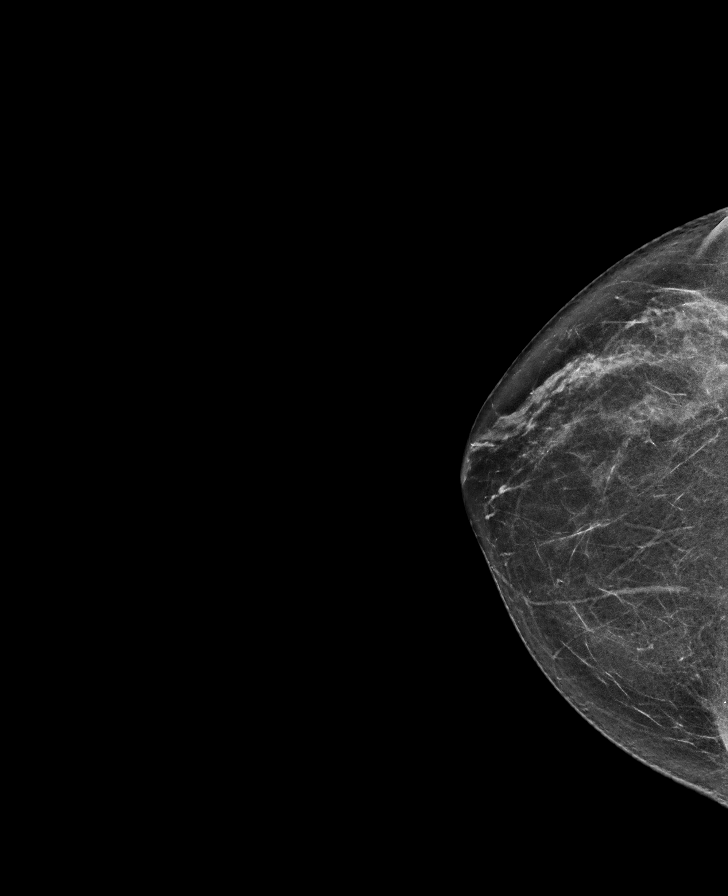

[L MLO synth-2D]
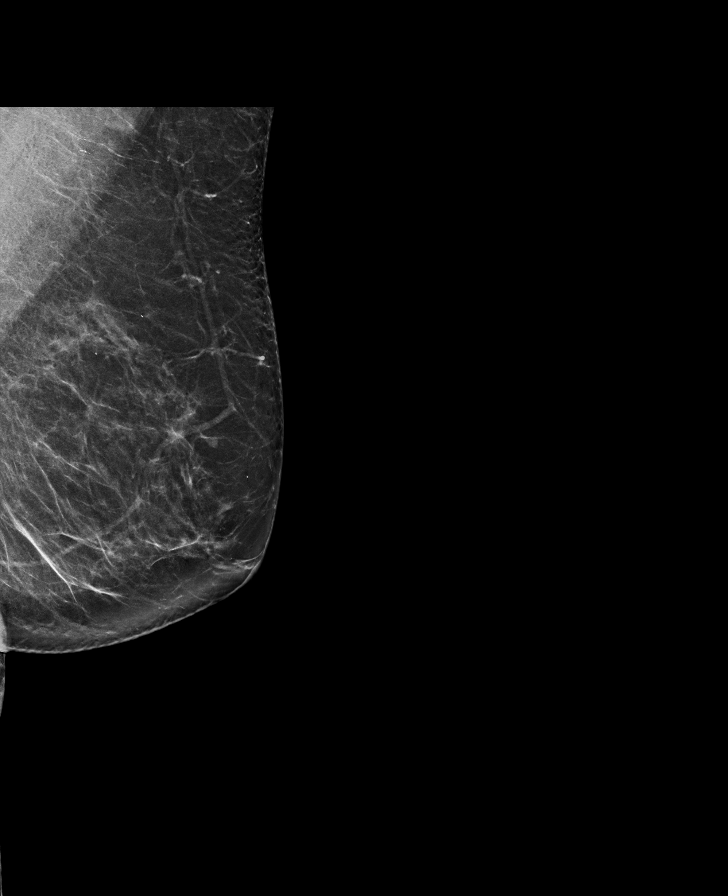

[L CC synth-2D]
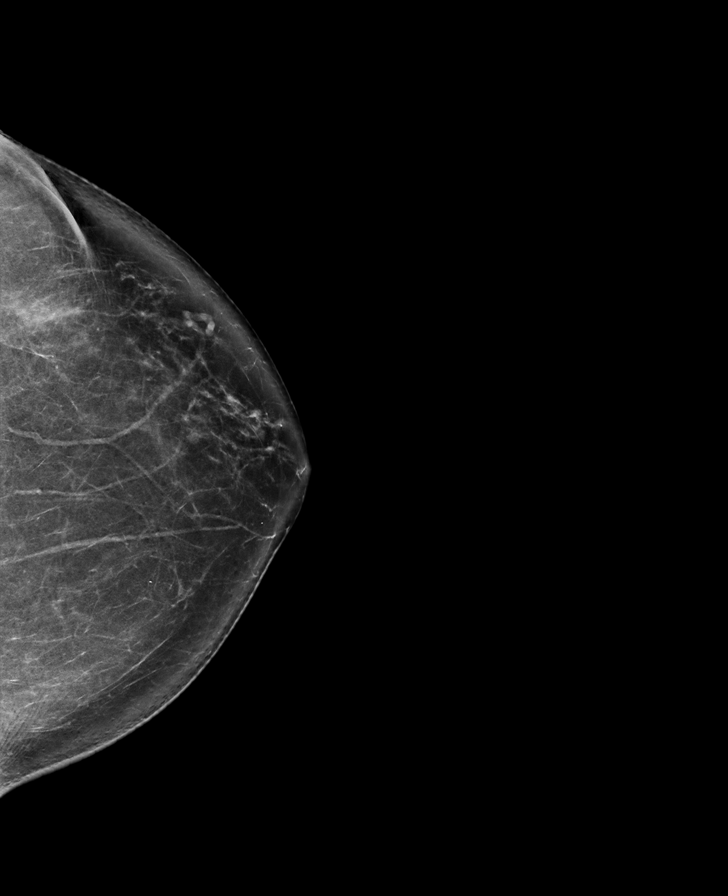

[L MLO tomo · tomo slice 39/77.0]
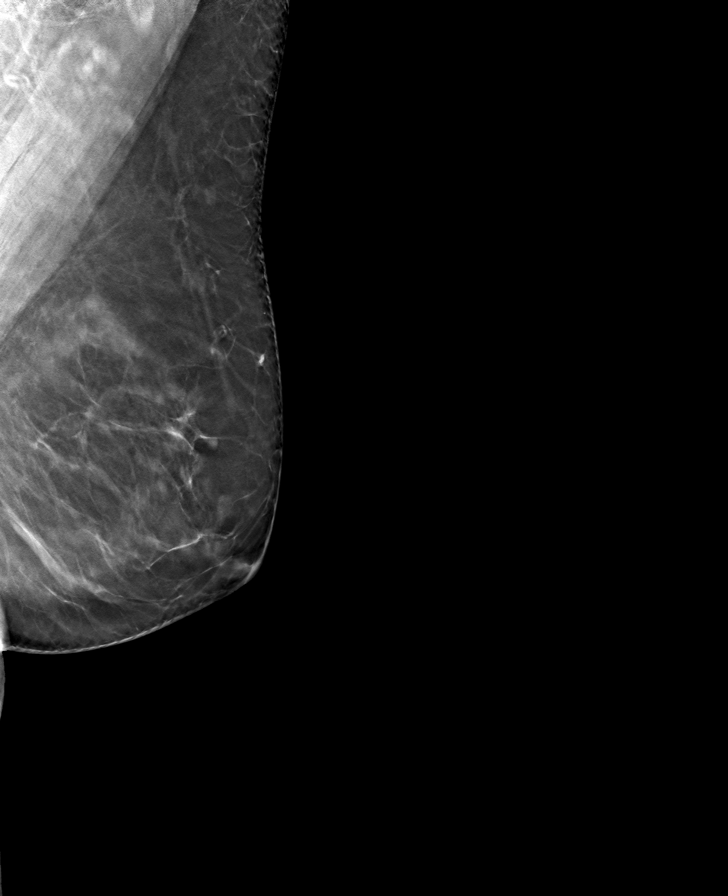

[R MLO tomo · tomo slice 35/68.0]
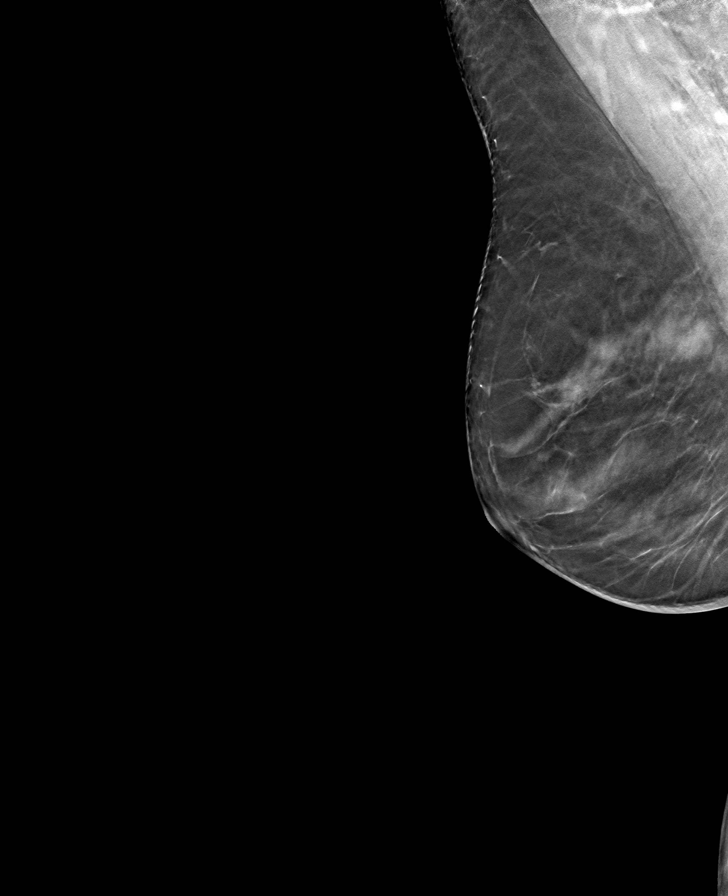

[L CC tomo · tomo slice 39/78.0]
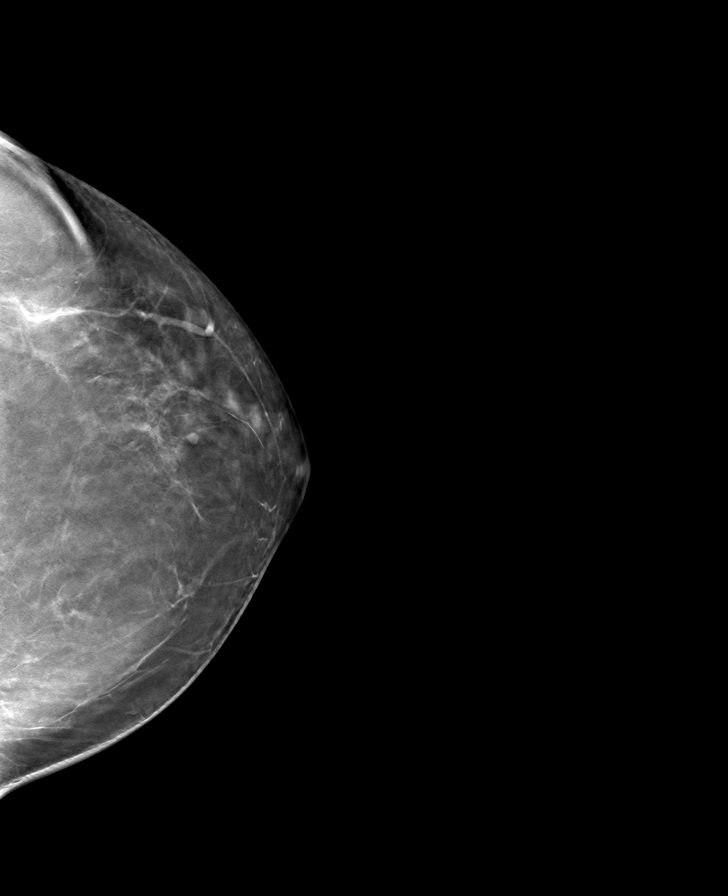

[R CC tomo · tomo slice 33/65.0]
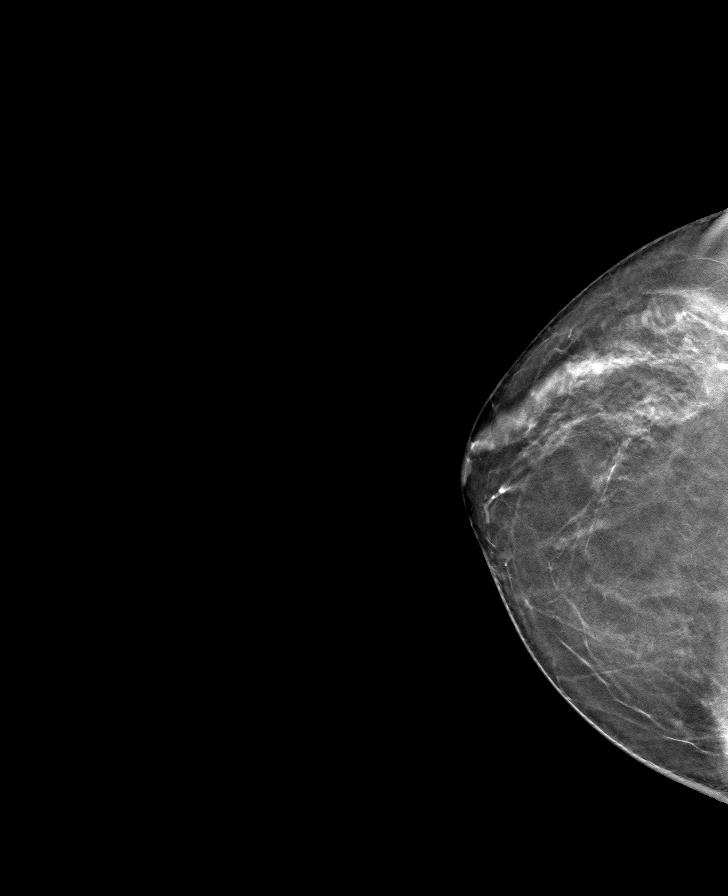

[8 of 24 positions shown; findings below may reference images not displayed]

ACR Breast Density Category b: There are scattered areas of
fibroglandular density.
FINDINGS: There are no findings suspicious for malignancy. Images were
processed with CAD.
IMPRESSION: No mammographic evidence of malignancy. A result letter of this
screening mammogram will be mailed directly to the patient.

RECOMMENDATION:
Screening mammogram in one year. (Code:[id number])

BI-RADS CATEGORY  1: Negative.
